# Patient Record
Sex: Male | Born: 1965 | Race: Black or African American | Hispanic: No | Marital: Married | Smoking: Never smoker
Health system: Southern US, Community
[De-identification: ages and names within clinical notes are randomized; demographics above are authoritative.]

---

## 2016-11-28 ENCOUNTER — Encounter: Payer: Self-pay | Admitting: Emergency Medicine

## 2016-11-28 ENCOUNTER — Emergency Department: Payer: No Typology Code available for payment source

## 2016-11-28 ENCOUNTER — Other Ambulatory Visit: Payer: Self-pay

## 2016-11-28 ENCOUNTER — Emergency Department
Admission: EM | Admit: 2016-11-28 | Discharge: 2016-11-28 | Disposition: A | Discharge status: Home / Self Care | Payer: No Typology Code available for payment source | Attending: Emergency Medicine | Admitting: Emergency Medicine

## 2016-11-28 ENCOUNTER — Emergency Department
Admission: EM | Admit: 2016-11-28 | Discharge: 2016-11-28 | Disposition: A | Discharge status: Home / Self Care | Payer: Self-pay | Attending: Emergency Medicine | Admitting: Emergency Medicine

## 2016-11-28 ENCOUNTER — Telehealth: Payer: Self-pay | Admitting: Emergency Medicine

## 2016-11-28 ENCOUNTER — Emergency Department: Payer: Self-pay

## 2016-11-28 DIAGNOSIS — R079 Chest pain, unspecified: Secondary | ICD-10-CM | POA: Diagnosis present

## 2016-11-28 DIAGNOSIS — R0789 Other chest pain: Secondary | ICD-10-CM | POA: Insufficient documentation

## 2016-11-28 DIAGNOSIS — Z5329 Procedure and treatment not carried out because of patient's decision for other reasons: Secondary | ICD-10-CM | POA: Insufficient documentation

## 2016-11-28 DIAGNOSIS — M25539 Pain in unspecified wrist: Secondary | ICD-10-CM | POA: Insufficient documentation

## 2016-11-28 LAB — CBC WITH DIFFERENTIAL/PLATELET
Basophils Absolute: 0 10*3/uL (ref 0–0.1)
Basophils Relative: 1 %
EOS ABS: 0.2 10*3/uL (ref 0–0.7)
Eosinophils Relative: 3 %
HCT: 39.8 % — ABNORMAL LOW (ref 40.0–52.0)
HEMOGLOBIN: 13.6 g/dL (ref 13.0–18.0)
LYMPHS ABS: 2.1 10*3/uL (ref 1.0–3.6)
Lymphocytes Relative: 26 %
MCH: 26.7 pg (ref 26.0–34.0)
MCHC: 34.2 g/dL (ref 32.0–36.0)
MCV: 78.1 fL — ABNORMAL LOW (ref 80.0–100.0)
MONOS PCT: 8 %
Monocytes Absolute: 0.7 10*3/uL (ref 0.2–1.0)
NEUTROS PCT: 62 %
Neutro Abs: 5.1 10*3/uL (ref 1.4–6.5)
Platelets: 160 10*3/uL (ref 150–440)
RBC: 5.1 MIL/uL (ref 4.40–5.90)
RDW: 15.1 % — ABNORMAL HIGH (ref 11.5–14.5)
WBC: 8 10*3/uL (ref 3.8–10.6)

## 2016-11-28 LAB — COMPREHENSIVE METABOLIC PANEL
ALBUMIN: 3.8 g/dL (ref 3.5–5.0)
ALK PHOS: 78 U/L (ref 38–126)
ALT: 32 U/L (ref 17–63)
ANION GAP: 7 (ref 5–15)
AST: 28 U/L (ref 15–41)
BUN: 13 mg/dL (ref 6–20)
CALCIUM: 9.3 mg/dL (ref 8.9–10.3)
CO2: 26 mmol/L (ref 22–32)
CREATININE: 1.38 mg/dL — AB (ref 0.61–1.24)
Chloride: 104 mmol/L (ref 101–111)
GFR calc Af Amer: >60 mL/min (ref >60)
GFR calc non Af Amer: 59 mL/min — ABNORMAL LOW (ref >60)
GLUCOSE: 108 mg/dL — AB (ref 65–99)
Potassium: 4 mmol/L (ref 3.5–5.1)
SODIUM: 137 mmol/L (ref 135–145)
Total Bilirubin: 0.7 mg/dL (ref 0.3–1.2)
Total Protein: 8.4 g/dL — ABNORMAL HIGH (ref 6.5–8.1)

## 2016-11-28 LAB — TROPONIN I
Troponin I: <0.03 ng/mL (ref <0.03)
Troponin I: <0.03 ng/mL (ref <0.03)

## 2016-11-28 LAB — BASIC METABOLIC PANEL
ANION GAP: 7 (ref 5–15)
BUN: 13 mg/dL (ref 6–20)
CO2: 28 mmol/L (ref 22–32)
Calcium: 9.5 mg/dL (ref 8.9–10.3)
Chloride: 105 mmol/L (ref 101–111)
Creatinine, Ser: 1.53 mg/dL — ABNORMAL HIGH (ref 0.61–1.24)
GFR calc Af Amer: 60 mL/min — ABNORMAL LOW (ref >60)
GFR, EST NON AFRICAN AMERICAN: 52 mL/min — AB (ref >60)
Glucose, Bld: 105 mg/dL — ABNORMAL HIGH (ref 65–99)
POTASSIUM: 3.8 mmol/L (ref 3.5–5.1)
SODIUM: 140 mmol/L (ref 135–145)

## 2016-11-28 LAB — CBC
HEMATOCRIT: 41.6 % (ref 40.0–52.0)
HEMOGLOBIN: 14.4 g/dL (ref 13.0–18.0)
MCH: 26.6 pg (ref 26.0–34.0)
MCHC: 34.6 g/dL (ref 32.0–36.0)
MCV: 76.9 fL — ABNORMAL LOW (ref 80.0–100.0)
Platelets: 167 10*3/uL (ref 150–440)
RBC: 5.41 MIL/uL (ref 4.40–5.90)
RDW: 14.9 % — ABNORMAL HIGH (ref 11.5–14.5)
WBC: 8.1 10*3/uL (ref 3.8–10.6)

## 2016-11-28 NOTE — ED Notes (Signed)
Pt called for room placement, no answer.  
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
## 2016-11-28 NOTE — ED Triage Notes (Signed)
Pt reports left side non radiating chest pain associated with turning or changing positions. Pt reports some associated SOB. Pt reports was seen last night for wrist pain and was told should come back to have splints placed.

## 2016-11-28 NOTE — ED Triage Notes (Signed)
First Nurse Note: Patient seen here last night, left. Returns for same.
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
## 2016-11-28 NOTE — Discharge Instructions (Signed)
Use Advil or Tylenol for the pain as needed. Please return for increased pain shortness breath or any other complaints. Follow up with your doctor early this coming week. I would not take the Advil or Tylenol for more than 3 or 4 days.

## 2016-11-28 NOTE — ED Notes (Signed)
Pt verbalizes understanding of discharge instructions.

## 2017-05-07 ENCOUNTER — Other Ambulatory Visit: Payer: Self-pay | Admitting: Internal Medicine

## 2017-05-07 DIAGNOSIS — Z0000 Encounter for general adult medical examination without abnormal findings: Secondary | ICD-10-CM

## 2017-05-15 ENCOUNTER — Ambulatory Visit
Admission: RE | Admit: 2017-05-15 | Discharge: 2017-05-15 | Disposition: A | Discharge status: Home / Self Care | Payer: 59 | Source: Ambulatory Visit | Attending: Internal Medicine | Admitting: Internal Medicine

## 2017-05-15 DIAGNOSIS — R932 Abnormal findings on diagnostic imaging of liver and biliary tract: Secondary | ICD-10-CM | POA: Insufficient documentation

## 2017-05-15 DIAGNOSIS — Z0000 Encounter for general adult medical examination without abnormal findings: Secondary | ICD-10-CM | POA: Diagnosis not present

## 2017-05-19 ENCOUNTER — Other Ambulatory Visit: Payer: Self-pay | Admitting: Internal Medicine

## 2017-05-19 DIAGNOSIS — K760 Fatty (change of) liver, not elsewhere classified: Secondary | ICD-10-CM

## 2017-06-04 ENCOUNTER — Ambulatory Visit: Payer: 59

## 2017-09-16 ENCOUNTER — Ambulatory Visit
Admission: RE | Admit: 2017-09-16 | Discharge: 2017-09-16 | Disposition: A | Discharge status: Home / Self Care | Payer: 59 | Source: Ambulatory Visit | Attending: Internal Medicine | Admitting: Internal Medicine

## 2017-09-16 DIAGNOSIS — R932 Abnormal findings on diagnostic imaging of liver and biliary tract: Secondary | ICD-10-CM | POA: Insufficient documentation

## 2017-09-16 DIAGNOSIS — K760 Fatty (change of) liver, not elsewhere classified: Secondary | ICD-10-CM | POA: Insufficient documentation

## 2018-08-11 IMAGING — CR DG CHEST 2V
2 series · 2 of 2 positions shown · non-contrast
Comparison: 11/28/2016

CLINICAL DATA: Left-sided chest pain

EXAM:
CHEST  2 VIEW

[chest pa]
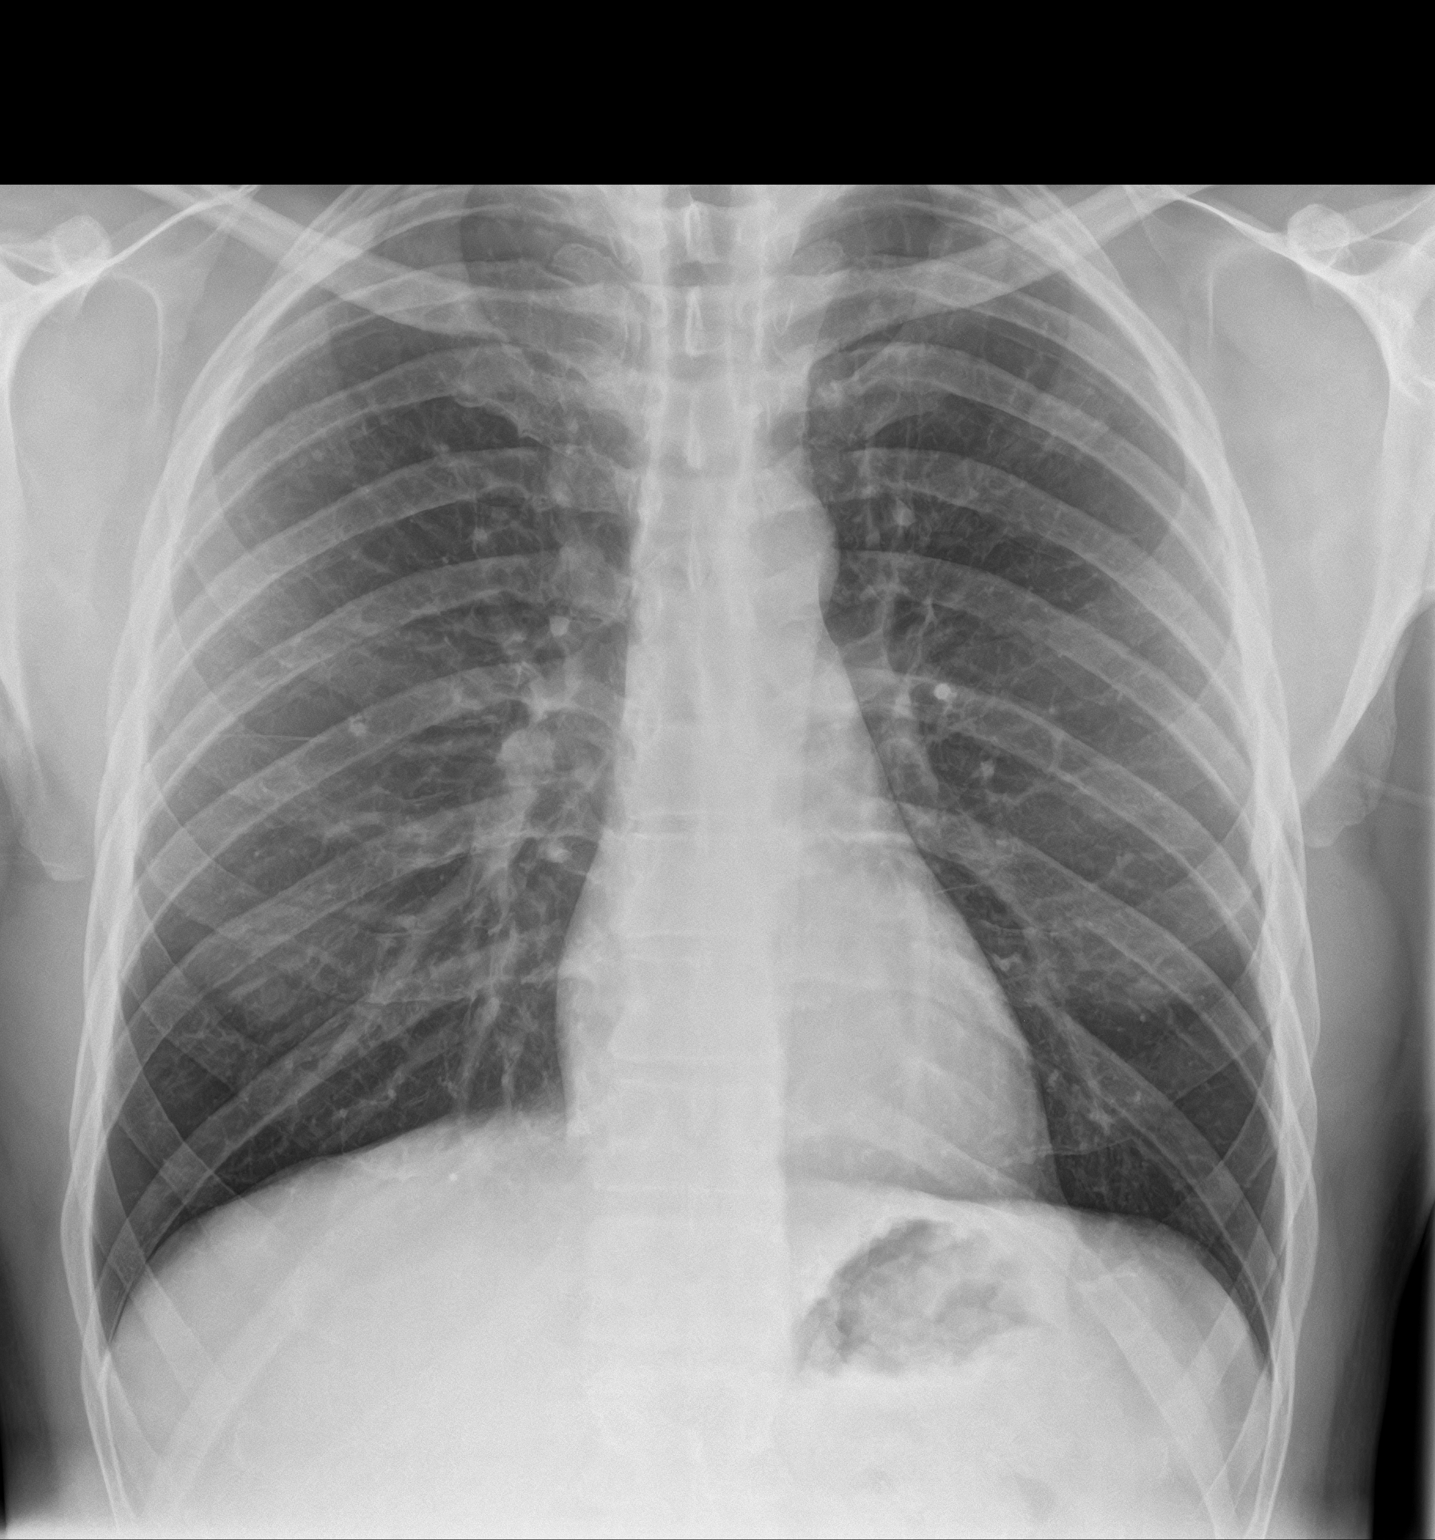

[chest lat]
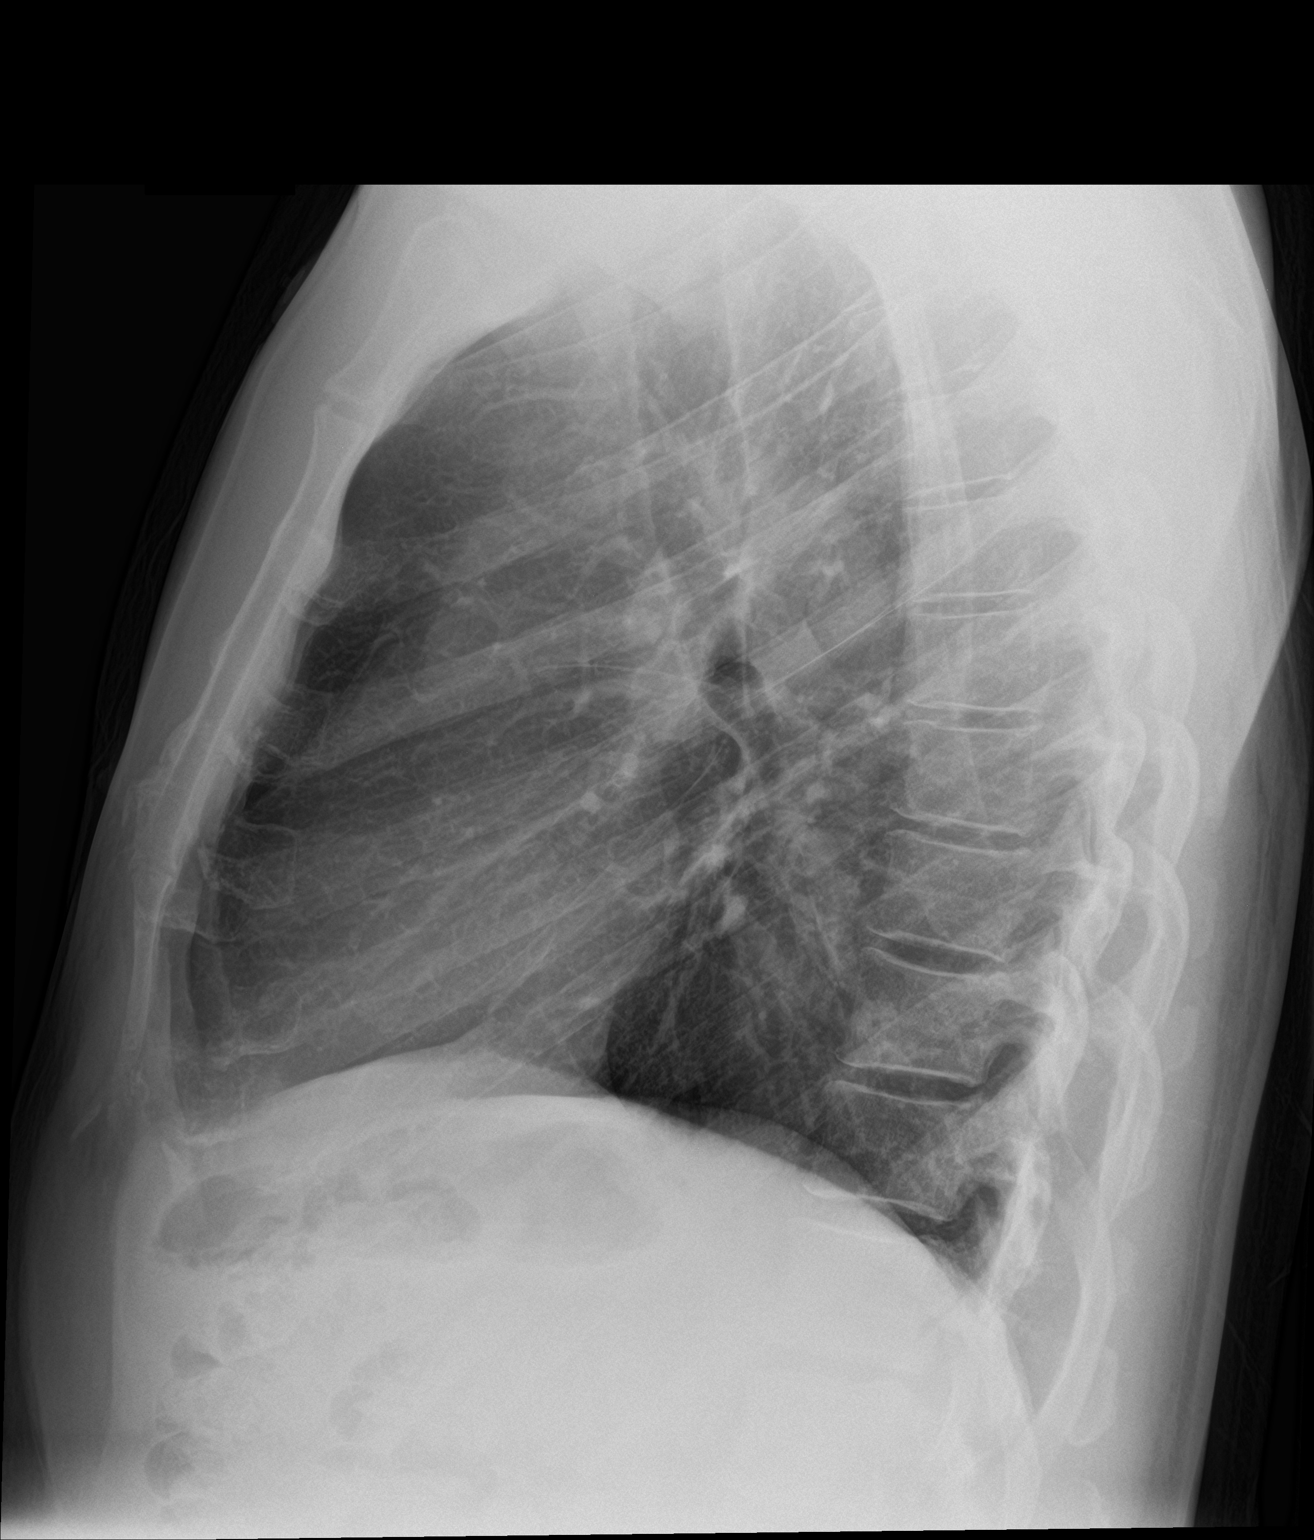

[2 of 2 positions shown; findings below may reference images not displayed]

FINDINGS: The heart size and mediastinal contours are within normal limits.
Both lungs are clear. The visualized skeletal structures are
unremarkable.
IMPRESSION: No active cardiopulmonary disease.
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
## 2019-05-30 IMAGING — US US ABDOMEN LIMITED
1 series · 14 of 25 positions shown · non-contrast
Comparison: Abdominal ultrasound May 15, 2017

CLINICAL DATA: Hepatic steatosis

EXAM:
ULTRASOUND ABDOMEN LIMITED RIGHT UPPER QUADRANT

[Series 1: us abdomen limited · 14 of 50 slices shown]
[im 1/50]
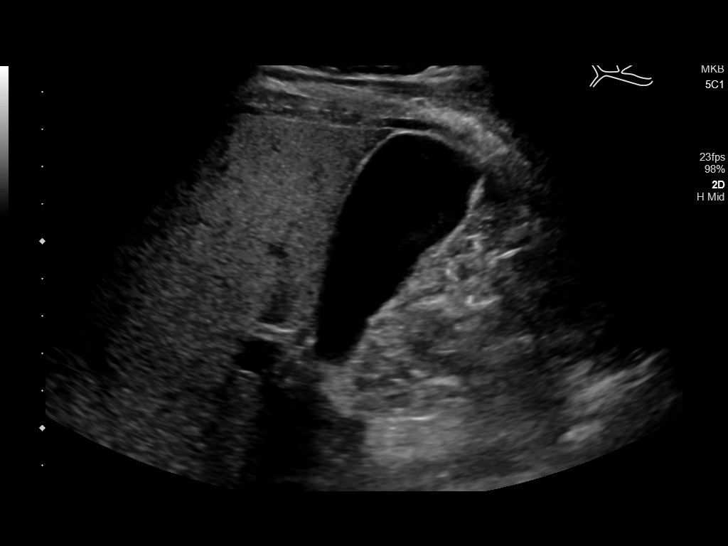
[im 5/50]
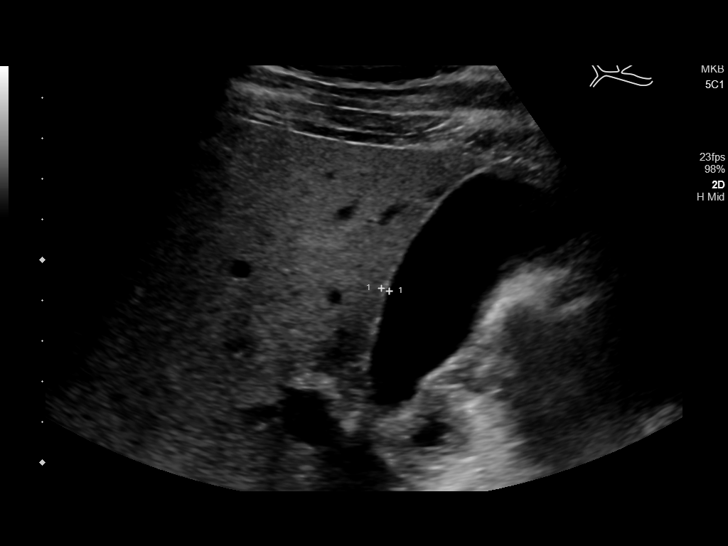
[im 9/50]
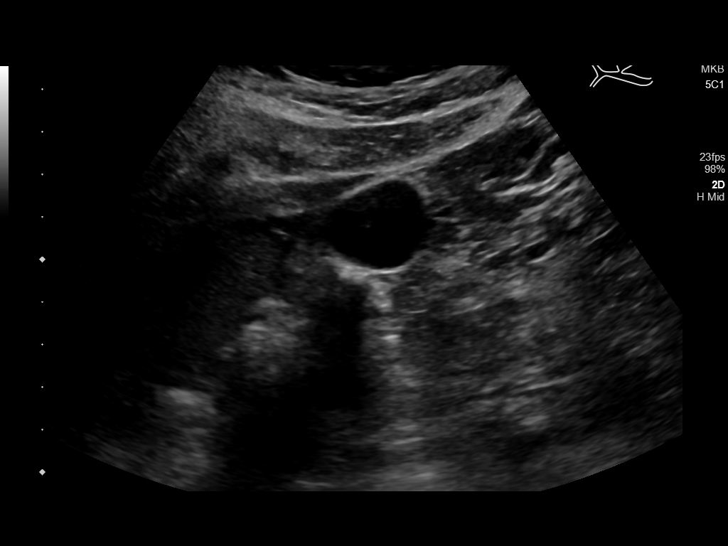
[im 13/50]
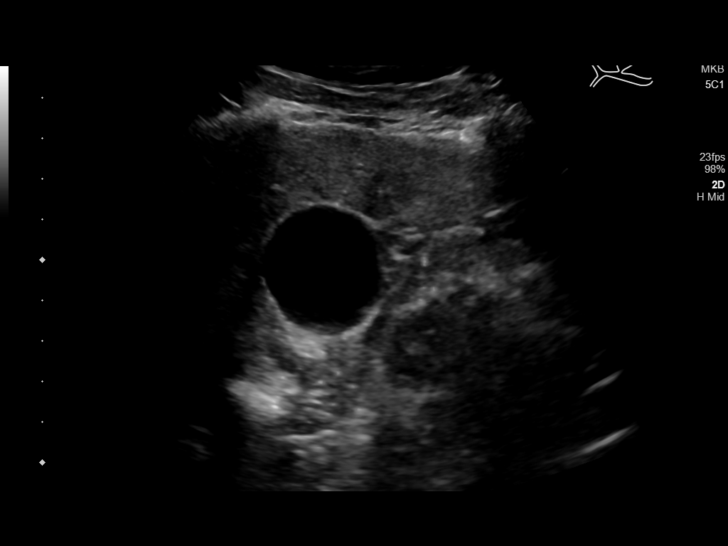
[im 17/50]
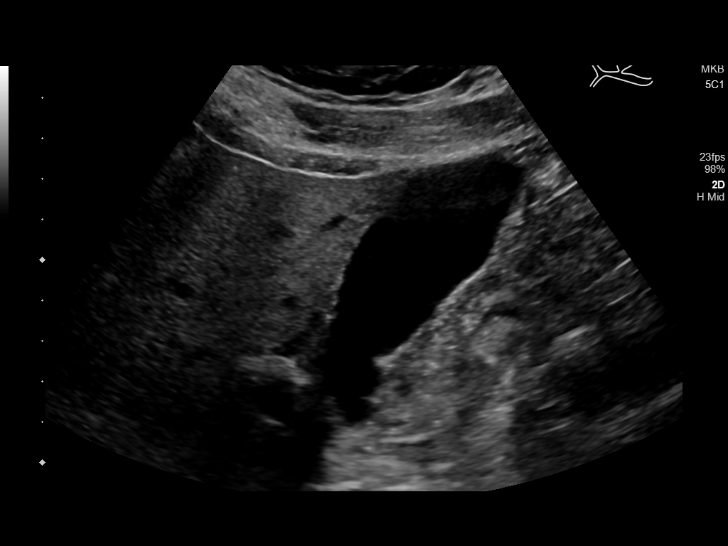
[im 19/50]
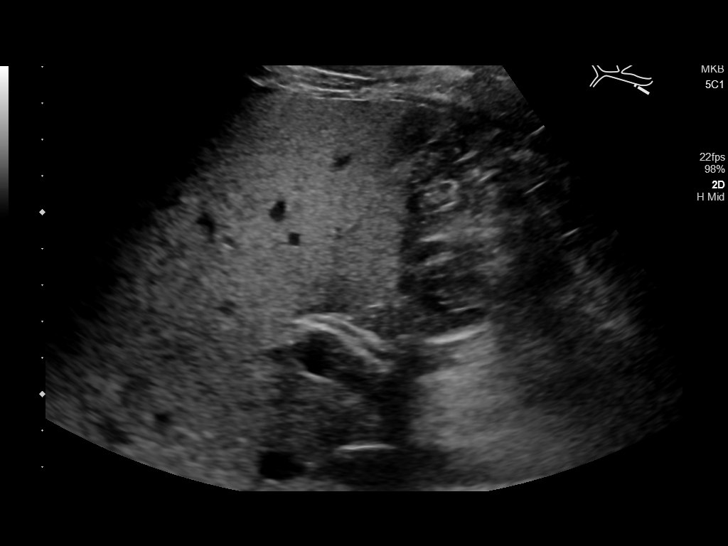
[im 23/50]
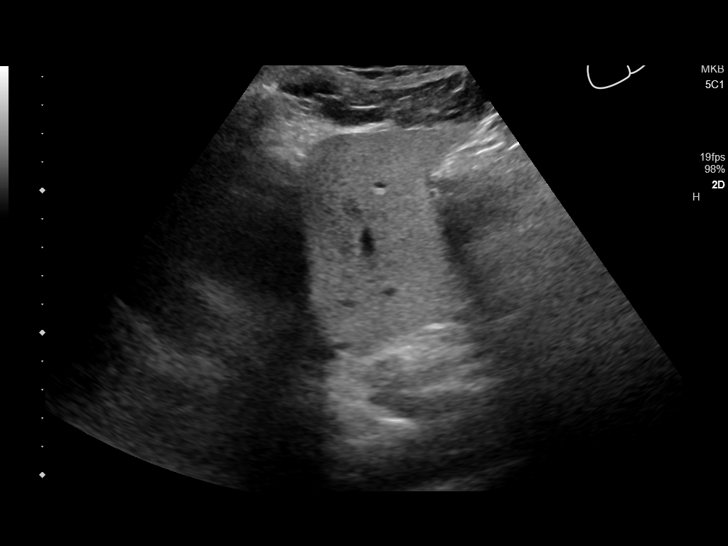
[im 27/50]
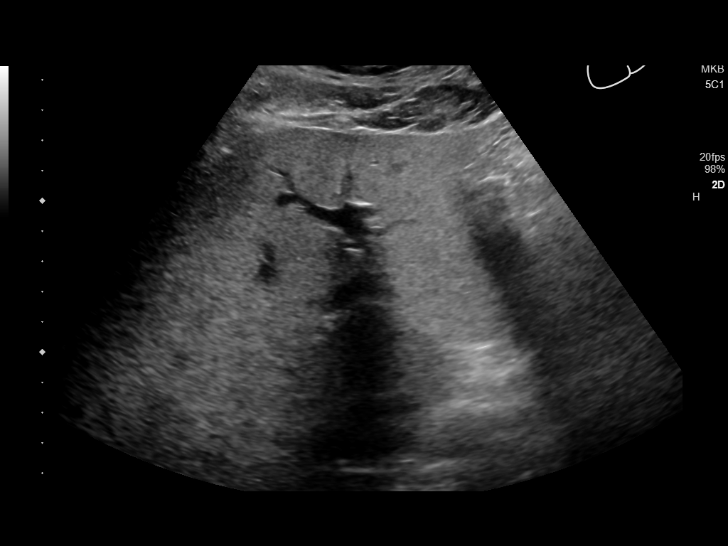
[im 31/50]
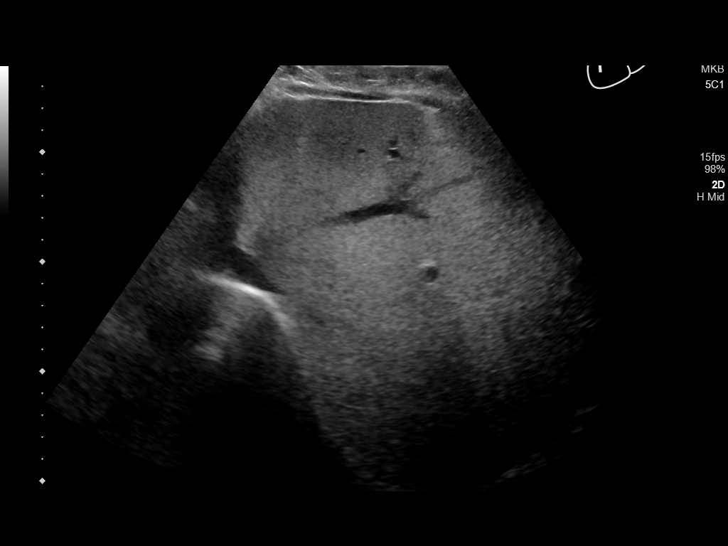
[im 33/50]
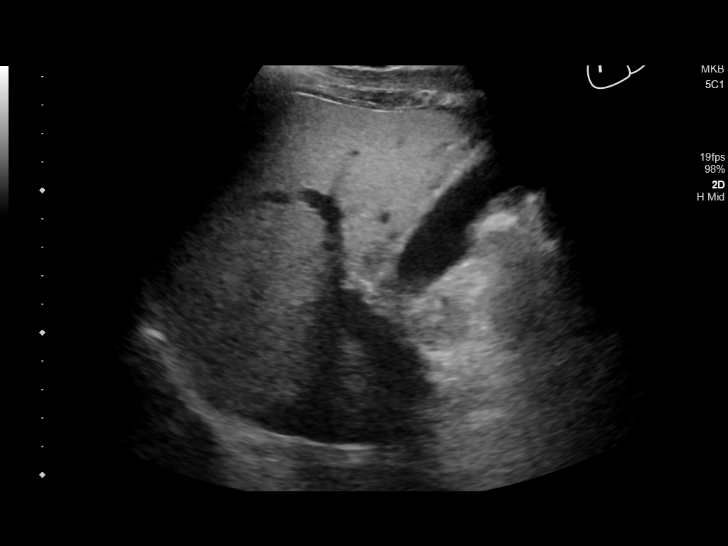
[im 37/50]
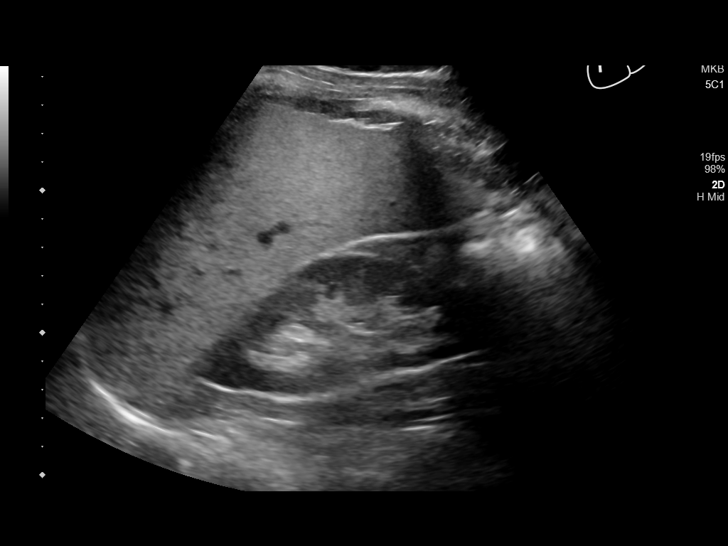
[im 41/50]
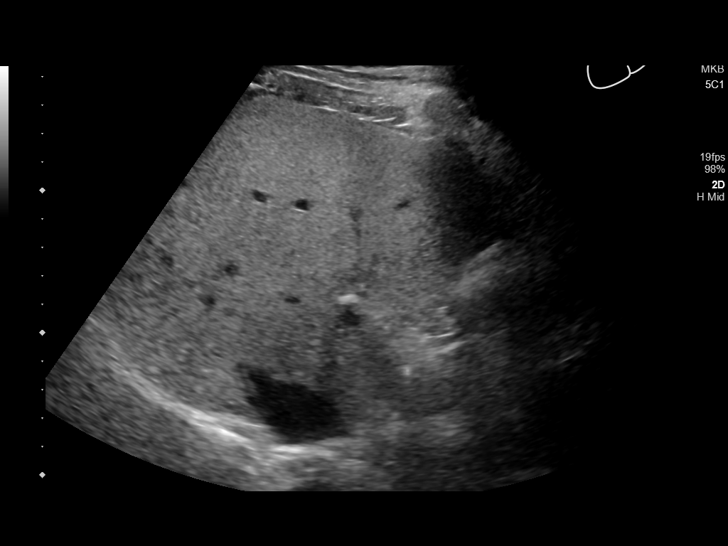
[im 45/50]
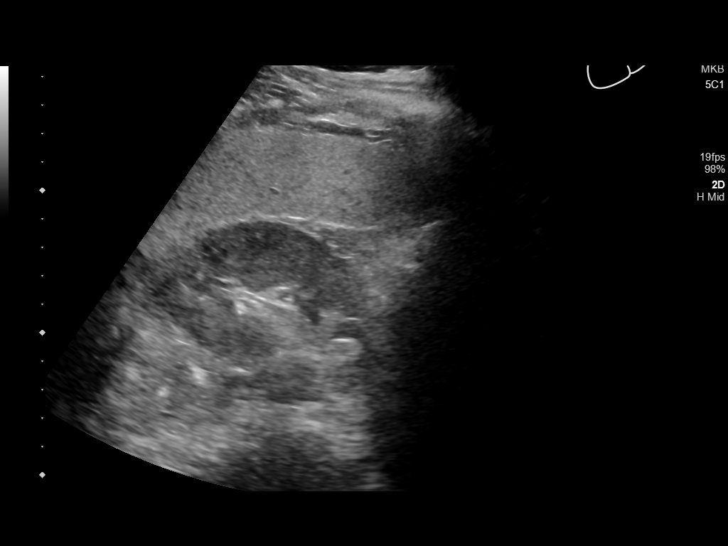
[im 50/50]
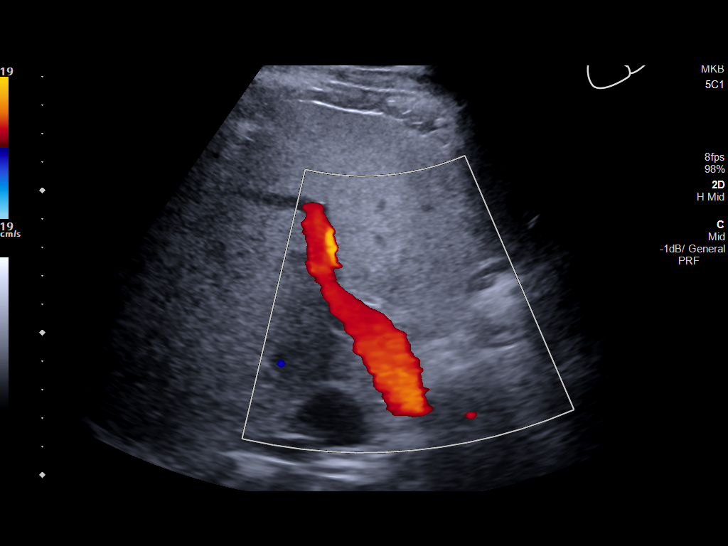

[14 of 25 positions shown; findings below may reference images not displayed]

FINDINGS: Gallbladder:

No gallstones or wall thickening visualized. There is no
pericholecystic fluid. No sonographic Murphy sign noted by
sonographer.

Common bile duct:

Diameter: 3 mm. No intrahepatic or extrahepatic biliary duct
dilatation.

Liver:

No focal lesion identified. Liver echogenicity overall is
increased.. Portal vein is patent on color Doppler imaging with
normal direction of blood flow towards the liver.
IMPRESSION: 1. Diffuse increase in liver echogenicity again noted, a finding
indicative of hepatic steatosis. While no focal liver lesions are
evident on this study, it must be cautioned that the sensitivity of
ultrasound for detection of focal liver lesions is diminished in
this circumstance.

2.  Study otherwise unremarkable.
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
## 2022-06-26 ENCOUNTER — Other Ambulatory Visit: Payer: Self-pay

## 2022-06-26 DIAGNOSIS — N1831 Chronic kidney disease, stage 3a: Secondary | ICD-10-CM

## 2022-07-01 ENCOUNTER — Ambulatory Visit
Admission: RE | Admit: 2022-07-01 | Discharge: 2022-07-01 | Disposition: A | Discharge status: Home / Self Care | Payer: 59 | Source: Ambulatory Visit | Attending: Internal Medicine | Admitting: Internal Medicine

## 2022-07-01 DIAGNOSIS — N1831 Chronic kidney disease, stage 3a: Secondary | ICD-10-CM

## 2024-03-02 ENCOUNTER — Encounter: Payer: Self-pay | Admitting: Emergency Medicine

## 2024-03-02 ENCOUNTER — Emergency Department: Admission: EM | Admit: 2024-03-02 | Discharge: 2024-03-02 | Disposition: A | Discharge status: Home / Self Care

## 2024-03-02 ENCOUNTER — Other Ambulatory Visit: Payer: Self-pay

## 2024-03-02 ENCOUNTER — Emergency Department

## 2024-03-02 DIAGNOSIS — R059 Cough, unspecified: Secondary | ICD-10-CM | POA: Diagnosis not present

## 2024-03-02 DIAGNOSIS — N189 Chronic kidney disease, unspecified: Secondary | ICD-10-CM | POA: Insufficient documentation

## 2024-03-02 DIAGNOSIS — K5900 Constipation, unspecified: Secondary | ICD-10-CM | POA: Diagnosis not present

## 2024-03-02 DIAGNOSIS — R519 Headache, unspecified: Secondary | ICD-10-CM | POA: Diagnosis present

## 2024-03-02 DIAGNOSIS — E860 Dehydration: Secondary | ICD-10-CM | POA: Insufficient documentation

## 2024-03-02 LAB — COMPREHENSIVE METABOLIC PANEL WITH GFR
ALT: 78 U/L — ABNORMAL HIGH (ref 0–44)
AST: 74 U/L — ABNORMAL HIGH (ref 15–41)
Albumin: 3.8 g/dL (ref 3.5–5.0)
Alkaline Phosphatase: 111 U/L (ref 38–126)
Anion gap: 10 (ref 5–15)
BUN: 16 mg/dL (ref 6–20)
CO2: 24 mmol/L (ref 22–32)
Calcium: 9 mg/dL (ref 8.9–10.3)
Chloride: 99 mmol/L (ref 98–111)
Creatinine, Ser: 1.59 mg/dL — ABNORMAL HIGH (ref 0.61–1.24)
GFR, Estimated: 51 mL/min — ABNORMAL LOW (ref >60)
Glucose, Bld: 103 mg/dL — ABNORMAL HIGH (ref 70–99)
Potassium: 3.9 mmol/L (ref 3.5–5.1)
Sodium: 133 mmol/L — ABNORMAL LOW (ref 135–145)
Total Bilirubin: 1.1 mg/dL (ref 0.0–1.2)
Total Protein: 8.7 g/dL — ABNORMAL HIGH (ref 6.5–8.1)

## 2024-03-02 LAB — CBC
HCT: 39.8 % (ref 39.0–52.0)
Hemoglobin: 14 g/dL (ref 13.0–17.0)
MCH: 26 pg (ref 26.0–34.0)
MCHC: 35.2 g/dL (ref 30.0–36.0)
MCV: 73.8 fL — ABNORMAL LOW (ref 80.0–100.0)
Platelets: 173 K/uL (ref 150–400)
RBC: 5.39 MIL/uL (ref 4.22–5.81)
RDW: 14.6 % (ref 11.5–15.5)
WBC: 9.5 K/uL (ref 4.0–10.5)
nRBC: 0 % (ref 0.0–0.2)

## 2024-03-02 LAB — URINALYSIS, ROUTINE W REFLEX MICROSCOPIC
Bilirubin Urine: NEGATIVE
Glucose, UA: NEGATIVE mg/dL
Hgb urine dipstick: NEGATIVE
Ketones, ur: NEGATIVE mg/dL
Leukocytes,Ua: NEGATIVE
Nitrite: NEGATIVE
Protein, ur: 30 mg/dL — AB
Specific Gravity, Urine: 1.032 — ABNORMAL HIGH (ref 1.005–1.030)
pH: 5 (ref 5.0–8.0)

## 2024-03-02 LAB — LIPASE, BLOOD: Lipase: 11 U/L (ref 11–51)

## 2024-03-02 LAB — RESP PANEL BY RT-PCR (RSV, FLU A&B, COVID)  RVPGX2
Influenza A by PCR: NEGATIVE
Influenza B by PCR: NEGATIVE
Resp Syncytial Virus by PCR: NEGATIVE
SARS Coronavirus 2 by RT PCR: NEGATIVE

## 2024-03-02 MED ORDER — POLYETHYLENE GLYCOL 3350 17 G PO PACK: 17.0000 g | PACK | Freq: Every day | ORAL | 0 refills | Status: AC

## 2024-03-02 MED ORDER — ACETAMINOPHEN 500 MG PO TABS
1000.0000 mg | ORAL_TABLET | Freq: Once | ORAL | Status: AC
  Administered 2024-03-02: 1000 mg via ORAL
  Filled 2024-03-02: qty 2

## 2024-03-02 MED ORDER — SODIUM CHLORIDE 0.9 % IV BOLUS
1000.0000 mL | Freq: Once | INTRAVENOUS | Status: AC
  Administered 2024-03-02: 1000 mL via INTRAVENOUS

## 2024-03-02 NOTE — Progress Notes (Addendum)
 Pt presents with dizziness, headache, dark urine, fatigue, chills, and constipation x 1 week

## 2024-03-02 NOTE — ED Notes (Signed)
 See triage note  Presents with generalized body aches    Chills  nausea No vomiting

## 2024-03-02 NOTE — Discharge Instructions (Addendum)
 Your evaluation in the emergency department was overall reassuring, though I do suspect you are somewhat dehydrated.  I have prescribed you a laxative to help you have bowel movement, and should keep continue to drink plenty of water daily to help with this.  Please do follow-up with your primary care provider for reevaluation, and return to the emergency department with any new or worsening symptoms.

## 2024-03-02 NOTE — ED Triage Notes (Signed)
 Patient arrives ambulatory by POV c/o being sick x 2 weeks. Constipation x 1 week. Cold chills, fever,  headache and dizziness. Went to UC and told to follow up here due to dark urine. Had negative flu and covid test PTA.

## 2024-03-02 NOTE — ED Provider Notes (Signed)
 [facility] Provider Note    Event Date/Time   First MD Initiated Contact with Patient 03/02/24 1500     (approximate)   History   Headache  Patient arrives ambulatory by POV c/o being sick x 2 weeks. Constipation x 1 week. Cold chills, fever,  headache and dizziness. Went to UC and told to follow up here due to dark urine. Had negative flu and covid test PTA.   HPI Sean Bentley is a 58 y.o. male PMH CKD, prediabetes, fatty liver disease presents for evaluation of multiple complaints - Patient states he has been feeling somewhat unwell for about the past 2 weeks or so.  Is primarily here because he has not had a bowel movement in about 1 week.  Denies any abdominal pain.  No fevers though has had some intermittent chills.  Has had some darkening of his urine and decreased p.o. intake.  Also notes some ongoing mild cough.  No chest pain or shortness of breath. -Went to urgent care today and was apparently told to come to emergency department due to dark urine.  May have stated abdominal discomfort there but denies any here. - Separately has been having some mild intermittent headaches but he notes more often in the evening.  No focal weakness, no vision change, not severe.  No neck pain or stiffness.  Per chart review, was seen in urgent care earlier today.  Rapid flu testing negative.  Urinalysis with negative nitrite and leuk esterase.  Trace ketones.  No blood.      Physical Exam   Triage Vital Signs: ED Triage Vitals  Encounter Vitals Group     BP 03/02/24 1306 (!) 117/92     Girls Systolic BP Percentile --      Girls Diastolic BP Percentile --      Boys Systolic BP Percentile --      Boys Diastolic BP Percentile --      Pulse Rate 03/02/24 1306 (!) 104     Resp 03/02/24 1306 18     Temp 03/02/24 1306 97.9 F (36.6 C)     Temp Source 03/02/24 1306 Oral     SpO2 03/02/24 1306 98 %     Weight 03/02/24 1305 251 lb (113.9 kg)     Height 03/02/24  1305 6' 4 (1.93 m)     Head Circumference --      Peak Flow --      Pain Score 03/02/24 1305 0     Pain Loc --      Pain Education --      Exclude from Growth Chart --     Most recent vital signs: Vitals:   03/02/24 1306  BP: (!) 117/92  Pulse: (!) 104  Resp: 18  Temp: 97.9 F (36.6 C)  SpO2: 98%     General: Awake, no distress.  HEENT: Normocephalic, atraumatic, no midline neck pain, no meningismus, full neck range of motion CV:  Good peripheral perfusion. RRR, RP 2+ Resp:  Normal effort. CTAB Abd:  No distention. Nontender to deep palpation throughout Neuro:  Aox4, CN II-XII intact, FNF wnl, finger taps fast b/l, 5/5 strength in bilateral finger extension/grip, arm flexion/extension, EHL/FHL. BUE AG 10+ sec no drift, BLE AG 5+ sec no drift. Ambulates with steady gait. SILT. Negative Rhomberg.    ED Results / Procedures / Treatments   Labs (all labs ordered are listed, but only abnormal results are displayed) Labs Reviewed  COMPREHENSIVE METABOLIC PANEL WITH GFR -  Abnormal; Notable for the following components:      Result Value   Sodium 133 (*)    Glucose, Bld 103 (*)    Creatinine, Ser 1.59 (*)    Total Protein 8.7 (*)    AST 74 (*)    ALT 78 (*)    GFR, Estimated 51 (*)    All other components within normal limits  CBC - Abnormal; Notable for the following components:   MCV 73.8 (*)    All other components within normal limits  RESP PANEL BY RT-PCR (RSV, FLU A&B, COVID)  RVPGX2  LIPASE, BLOOD  URINALYSIS, ROUTINE W REFLEX MICROSCOPIC     EKG  N/a   RADIOLOGY Radiology interpreted by myself radiology report reviewed.  No acute pathology identified.    PROCEDURES:  Critical Care performed: No  Procedures   MEDICATIONS ORDERED IN ED: Medications  acetaminophen (TYLENOL) tablet 1,000 mg (1,000 mg Oral Given 03/02/24 1542)  sodium chloride 0.9 % bolus 1,000 mL (0 mLs Intravenous Stopped 03/02/24 1749)     IMPRESSION / MDM / ASSESSMENT AND  PLAN / ED COURSE  I reviewed the triage vital signs and the nursing notes.                              DDX/MDM/AP: Differential diagnosis includes, but is not limited to, viral syndrome, suspect benign constipation and do not suspect acute intra-abdominal pathology given very reassuring exam here and no complaints of abdominal pain.  Denies any rectal fullness sensation, offered screening rectal exam the patient declined.  Consider possibility of superimposed pneumonia given ongoing cough and duration of URI like symptoms.  Suspect some degree of dehydration likely contributing with decreased p.o. intake and darkening of urine.  Doubt underlying UTI given reassuring urine earlier today.  Consider COVID-19, rapid flu testing negative earlier today.  Plan: - Labs - Viral swab - Chest x-ray, XR abdomen - IV fluid - Reassess  Patient's presentation is most consistent with acute presentation with potential threat to life or bodily function.  The patient is on the cardiac monitor to evaluate for evidence of arrhythmia and/or significant heart rate changes.  ED course below.  Workup unremarkable, very mild transaminitis though known history of fatty liver and very benign abdominal exam here.  Mild AKI.  Patient feeling much better after IV fluid and Tylenol.  No evidence of acute pathology at this time, x-ray abdomen showing moderate stool burden.  Rx MiraLAX, counseled on oral hydration.  Plan for PMD follow-up.  ED return precautions in place.  Patient agrees with plan.  Clinical Course as of 03/02/24 1814  Wed Mar 02, 2024  1524 CBC with no leukocytosis or anemia  CMP reviewed, overall unremarkable.  Very mild elevation of AST and ALT, last comparisons 7 years ago.  Bilirubin normal.  Creatinine within baseline range.  Lipase normal [MM]  1641 Viral swab neg [MM]  1708 XR abdomen: IMPRESSION: Nonobstructive bowel-gas pattern.  Moderate colonic stool burden.   [MM]  1708  CXR: IMPRESSION: No acute cardiopulmonary findings.   [MM]  1804 Patient reevaluated, feeling much better after IV fluid  Reassured by overall unremarkable workup.  Will plan to follow-up with his primary care Dr[facility].  Rx MiraLAX.  Counseled on oral hydration.  ED return precautions in place.  Patient and family agree with plan. [MM]    Clinical Course User Index [MM] Clarine Ozell LABOR, MD  FINAL CLINICAL IMPRESSION(S) / ED DIAGNOSES   Final diagnoses:  Dehydration  Constipation, unspecified constipation type     Rx / DC Orders   ED Discharge Orders          Ordered    polyethylene glycol (MIRALAX) 17 g packet  Daily        03/02/24 1807             Note:  This document was prepared using Dragon voice recognition software and may include unintentional dictation errors.   Clarine Ozell LABOR, MD 03/02/24 [phone]
